# Patient Record
Sex: Male | Born: 1990 | Race: White | Hispanic: No | Marital: Single | Smoking: Current every day smoker
Health system: Southern US, Community
[De-identification: ages and names within clinical notes are randomized; demographics above are authoritative.]

---
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
## 2011-01-20 ENCOUNTER — Encounter: Payer: Self-pay | Admitting: "Endocrinology

## 2011-07-04 ENCOUNTER — Emergency Department: Payer: Self-pay | Admitting: Unknown Physician Specialty
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
## 2014-12-31 DEATH — deceased

## 2015-08-27 ENCOUNTER — Emergency Department
Admission: EM | Admit: 2015-08-27 | Discharge: 2015-08-27 | Disposition: A | Discharge status: Home / Self Care | Payer: Self-pay | Attending: Emergency Medicine | Admitting: Emergency Medicine

## 2015-08-27 ENCOUNTER — Emergency Department: Payer: Self-pay

## 2015-08-27 ENCOUNTER — Encounter: Payer: Self-pay | Admitting: Emergency Medicine

## 2015-08-27 DIAGNOSIS — S63502A Unspecified sprain of left wrist, initial encounter: Secondary | ICD-10-CM

## 2015-08-27 DIAGNOSIS — Y9301 Activity, walking, marching and hiking: Secondary | ICD-10-CM | POA: Insufficient documentation

## 2015-08-27 DIAGNOSIS — Y998 Other external cause status: Secondary | ICD-10-CM | POA: Insufficient documentation

## 2015-08-27 DIAGNOSIS — Y92828 Other wilderness area as the place of occurrence of the external cause: Secondary | ICD-10-CM | POA: Insufficient documentation

## 2015-08-27 DIAGNOSIS — Z720 Tobacco use: Secondary | ICD-10-CM | POA: Insufficient documentation

## 2015-08-27 DIAGNOSIS — W010XXA Fall on same level from slipping, tripping and stumbling without subsequent striking against object, initial encounter: Secondary | ICD-10-CM | POA: Insufficient documentation

## 2015-08-27 MED ORDER — IBUPROFEN 800 MG PO TABS
800.0000 mg | ORAL_TABLET | Freq: Once | ORAL | Status: AC
  Administered 2015-08-27: 800 mg via ORAL
  Filled 2015-08-27: qty 1

## 2015-08-27 MED ORDER — NAPROXEN 500 MG PO TABS: 500.0000 mg | ORAL_TABLET | Freq: Two times a day (BID) | ORAL | Status: AC

## 2015-08-27 MED ORDER — OXYCODONE-ACETAMINOPHEN 5-325 MG PO TABS
1.0000 | ORAL_TABLET | Freq: Once | ORAL | Status: AC
  Administered 2015-08-27: 1 via ORAL
  Filled 2015-08-27: qty 1

## 2015-08-27 MED ORDER — TRAMADOL HCL 50 MG PO TABS: 50.0000 mg | ORAL_TABLET | Freq: Four times a day (QID) | ORAL | Status: AC | PRN

## 2015-08-27 NOTE — ED Notes (Signed)
Fell last pm, pain in left wrist

## 2015-08-27 NOTE — ED Provider Notes (Signed)
[facility] Emergency Department Provider Note  ____________________________________________  Time seen: Approximately 6:43 PM  I have reviewed the triage vital signs and the nursing notes.   HISTORY  Chief Complaint Wrist Pain    HPI Roy Stevens is a 24 y.o. male left wrist pain secondary to a slip and fall yesterday. Patient said he started going up a hill and fall with the left hand. States pain distal radius. Patient rated his pain as a 9/10. Patient is use Ace wrap to support the wrist since last night. Stated pain is resolved using over-the-counter ibuprofen. Patient is right-hand dominant  History reviewed. No pertinent past medical history.  There are no active problems to display for this patient.   History reviewed. No pertinent past surgical history.  Current Outpatient Rx  Name  Route  Sig  Dispense  Refill  . naproxen (NAPROSYN) 500 MG tablet   Oral   Take 1 tablet (500 mg total) by mouth 2 (two) times daily with a meal.   20 tablet   0   . traMADol (ULTRAM) 50 MG tablet   Oral   Take 1 tablet (50 mg total) by mouth every 6 (six) hours as needed for moderate pain.   12 tablet   0     Allergies Review of patient's allergies indicates no known allergies.  History reviewed. No pertinent family history.  Social History Social History  Substance Use Topics  . Smoking status: Current Every Day Smoker  . Smokeless tobacco: None  . Alcohol Use: Yes    Review of Systems Constitutional: No fever/chills Eyes: No visual changes. ENT: No sore throat. Cardiovascular: Denies chest pain. Respiratory: Denies shortness of breath. Gastrointestinal: No abdominal pain.  No nausea, no vomiting.  No diarrhea.  No constipation. Genitourinary: Negative for dysuria. Musculoskeletal: Negative for back pain. Skin: Negative for rash. Neurological: Negative for headaches, focal weakness or numbness.  10-point ROS otherwise  negative.  ____________________________________________   PHYSICAL EXAM:  VITAL SIGNS: ED Triage Vitals  Enc Vitals Group     BP 08/27/15 1832 139/85 mmHg     Pulse Rate 08/27/15 1832 66     Resp 08/27/15 1832 16     Temp 08/27/15 1832 98.4 F (36.9 C)     Temp Source 08/27/15 1832 Oral     SpO2 08/27/15 1832 98 %     Weight 08/27/15 1832 140 lb (63.504 kg)     Height 08/27/15 1832 5\' 10"  (1.778 m)     Head Cir --      Peak Flow --      Pain Score 08/27/15 1830 9     Pain Loc --      Pain Edu? --      Excl. in GC? --     Constitutional: Alert and oriented. Well appearing and in no acute distress. Eyes: Conjunctivae are normal. PERRL. EOMI. Head: Atraumatic. Nose: No congestion/rhinnorhea. Mouth/Throat: Mucous membranes are moist.  Oropharynx non-erythematous. Neck: No stridor.  No cervical spine tenderness to palpation. Hematological/Lymphatic/Immunilogical: No cervical lymphadenopathy. Cardiovascular: Normal rate, regular rhythm. Grossly normal heart sounds.  Good peripheral circulation. Respiratory: Normal respiratory effort.  No retractions. Lungs CTAB. Gastrointestinal: Soft and nontender. No distention. No abdominal bruits. No CVA tenderness. Musculoskeletal: Left wrist pain  Neurologic:  Normal speech and language. No gross focal neurologic deficits are appreciated. No gait instability. Skin:  Skin is warm, dry and intact. No rash noted. Psychiatric: Mood and affect are normal. Speech and behavior are  normal.  ____________________________________________   LABS (all labs ordered are listed, but only abnormal results are displayed)  Labs Reviewed - No data to display ____________________________________________  EKG   ____________________________________________  RADIOLOGY  X-rays of left wrist unremarkable.  I, Joni Reining, personally viewed and evaluated these images (plain radiographs) as part of my medical decision making.    ________________________________________   PROCEDURES  Procedure(s) performed: None  Critical Care performed: No  ____________________________________________   INITIAL IMPRESSION / ASSESSMENT AND PLAN / ED COURSE  Pertinent labs & imaging results that were available during my care of the patient were reviewed by me and considered in my medical decision making (see chart for details).  Brain left wrist. There is no x-ray results with patient. Patient placed in a Velcro wrist splint and given a prescription for naproxen take as directed. Patient is follow "clinic. ____________________________________________   FINAL CLINICAL IMPRESSION(S) / ED DIAGNOSES  Final diagnoses:  Sprain of left wrist, initial encounter      Joni Reining, PA-C 08/27/15 1915  Darien Ramus, MD 08/27/15 2348

## 2015-08-27 NOTE — Discharge Instructions (Signed)
Wear splint for 3-5 days as needed. °

## 2015-08-27 NOTE — ED Notes (Signed)
Left wrist with bruising, pain. Wrist above injury +pulse. Hand cool - pt says he was applying ice hours ago. Cap refill slow.
